# Patient Record
Sex: Male | Born: 1965 | Race: White | Hispanic: No | Marital: Married | Smoking: Current every day smoker
Health system: Southern US, Community
[De-identification: ages and names within clinical notes are randomized; demographics above are authoritative.]

## PROBLEM LIST (undated history)

## (undated) DIAGNOSIS — E785 Hyperlipidemia, unspecified: Secondary | ICD-10-CM

## (undated) HISTORY — DX: Hyperlipidemia, unspecified: E78.5

---

## 2002-02-04 ENCOUNTER — Encounter: Payer: Self-pay | Admitting: Family Medicine

## 2002-02-04 ENCOUNTER — Ambulatory Visit (HOSPITAL_COMMUNITY): Admission: RE | Admit: 2002-02-04 | Discharge: 2002-02-04 | Payer: Self-pay | Admitting: Family Medicine

## 2008-05-19 ENCOUNTER — Ambulatory Visit: Payer: Self-pay | Admitting: Family Medicine

## 2008-05-19 DIAGNOSIS — H5310 Unspecified subjective visual disturbances: Secondary | ICD-10-CM | POA: Insufficient documentation

## 2008-05-19 DIAGNOSIS — R55 Syncope and collapse: Secondary | ICD-10-CM | POA: Insufficient documentation

## 2008-05-22 ENCOUNTER — Encounter (INDEPENDENT_AMBULATORY_CARE_PROVIDER_SITE_OTHER): Payer: Self-pay | Admitting: *Deleted

## 2008-05-22 LAB — CONVERTED CEMR LAB
ALT: 32 units/L (ref 0–53)
AST: 21 units/L (ref 0–37)
Albumin: 4 g/dL (ref 3.5–5.2)
Alkaline Phosphatase: 72 units/L (ref 39–117)
BUN: 9 mg/dL (ref 6–23)
Basophils Absolute: 0 10*3/uL (ref 0.0–0.1)
Basophils Relative: 0.3 % (ref 0.0–3.0)
Bilirubin, Direct: 0.1 mg/dL (ref 0.0–0.3)
CO2: 29 meq/L (ref 19–32)
Calcium: 9.4 mg/dL (ref 8.4–10.5)
Chloride: 109 meq/L (ref 96–112)
Cholesterol: 193 mg/dL (ref 0–200)
Creatinine, Ser: 1 mg/dL (ref 0.4–1.5)
Eosinophils Absolute: 0.2 10*3/uL (ref 0.0–0.7)
Eosinophils Relative: 3 % (ref 0.0–5.0)
GFR calc Af Amer: 106 mL/min
GFR calc non Af Amer: 88 mL/min
Glucose, Bld: 86 mg/dL (ref 70–99)
HCT: 46.1 % (ref 39.0–52.0)
HDL: 37.1 mg/dL — ABNORMAL LOW (ref >39.0)
Hemoglobin: 16.5 g/dL (ref 13.0–17.0)
LDL Cholesterol: 144 mg/dL — ABNORMAL HIGH (ref 0–99)
Lymphocytes Relative: 28.7 % (ref 12.0–46.0)
MCHC: 35.7 g/dL (ref 30.0–36.0)
MCV: 95.8 fL (ref 78.0–100.0)
Monocytes Absolute: 0.6 10*3/uL (ref 0.1–1.0)
Monocytes Relative: 7.8 % (ref 3.0–12.0)
Neutro Abs: 4.3 10*3/uL (ref 1.4–7.7)
Neutrophils Relative %: 60.2 % (ref 43.0–77.0)
Platelets: 172 10*3/uL (ref 150–400)
Potassium: 4.4 meq/L (ref 3.5–5.1)
RBC: 4.81 M/uL (ref 4.22–5.81)
RDW: 12 % (ref 11.5–14.6)
Sodium: 144 meq/L (ref 135–145)
TSH: 1.17 microintl units/mL (ref 0.35–5.50)
Total Bilirubin: 1.3 mg/dL — ABNORMAL HIGH (ref 0.3–1.2)
Total CHOL/HDL Ratio: 5.2
Total Protein: 6.5 g/dL (ref 6.0–8.3)
Triglycerides: 61 mg/dL (ref 0–149)
VLDL: 12 mg/dL (ref 0–40)
WBC: 7.1 10*3/uL (ref 4.5–10.5)

## 2009-06-15 ENCOUNTER — Ambulatory Visit: Payer: Self-pay | Admitting: Family Medicine

## 2009-06-15 DIAGNOSIS — J019 Acute sinusitis, unspecified: Secondary | ICD-10-CM | POA: Insufficient documentation

## 2012-03-22 ENCOUNTER — Telehealth: Payer: Self-pay | Admitting: Family Medicine

## 2012-03-22 ENCOUNTER — Ambulatory Visit: Payer: Self-pay | Admitting: Family Medicine

## 2012-03-22 NOTE — Telephone Encounter (Signed)
Can have any 15 minute opening available

## 2012-03-22 NOTE — Telephone Encounter (Signed)
knott at opening of rectum does not have any pain x 1-wk where can he be worked in to best suit your schedule, please review and advise  Thanks

## 2012-03-22 NOTE — Telephone Encounter (Signed)
Please advise 

## 2012-03-22 NOTE — Telephone Encounter (Signed)
Coming in today at 4pm.

## 2012-03-25 ENCOUNTER — Encounter: Payer: Self-pay | Admitting: Family Medicine

## 2012-03-25 ENCOUNTER — Ambulatory Visit (INDEPENDENT_AMBULATORY_CARE_PROVIDER_SITE_OTHER): Payer: 59 | Admitting: Family Medicine

## 2012-03-25 VITALS — BP 129/82 | HR 87 | Temp 98.6°F | Ht 75.0 in | Wt 207.4 lb

## 2012-03-25 DIAGNOSIS — K6289 Other specified diseases of anus and rectum: Secondary | ICD-10-CM

## 2012-03-25 NOTE — Patient Instructions (Addendum)
We'll call you with your surgery appt This is not infected and there is nothing to worry about at this time Call with any questions or concerns Hang in there!!!

## 2012-03-25 NOTE — Progress Notes (Signed)
  Subjective:    Patient ID: Maurice Fuller, male    DOB: 1966/05/27, 46 y.o.   MRN: [id number]  HPI 'knot in my rear'- 1st noticed last Monday, not painful.  Located at rectum.  No drainage.  Wife doesn't feel it looks like a hemorrhoid.  No hx of similar.  First discovered in the shower.  No lumps elsewhere on the body.   Review of Systems For ROS see HPI     Objective:   Physical Exam  Vitals reviewed. Constitutional: He appears well-developed and well-nourished. No distress.  Genitourinary: Rectal exam shows mass (sebaceous cyst at 10 o'clock position (when upright and leaning forward on table) w/out redness or tenderness ). Rectal exam shows no external hemorrhoid, no fissure, no tenderness and anal tone normal.             Assessment & Plan:

## 2012-03-30 ENCOUNTER — Encounter: Payer: Self-pay | Admitting: Family Medicine

## 2012-03-30 NOTE — Assessment & Plan Note (Signed)
New.  No evidence of infxn so no abx required.  Refer to surgery for excision.  Reviewed dx w/ pt and wife- reassurance provided.

## 2012-04-08 ENCOUNTER — Ambulatory Visit (INDEPENDENT_AMBULATORY_CARE_PROVIDER_SITE_OTHER): Payer: 59 | Admitting: General Surgery

## 2012-05-05 ENCOUNTER — Encounter: Payer: Self-pay | Admitting: Family Medicine

## 2012-05-05 ENCOUNTER — Ambulatory Visit (INDEPENDENT_AMBULATORY_CARE_PROVIDER_SITE_OTHER): Payer: 59 | Admitting: Family Medicine

## 2012-05-05 VITALS — BP 118/75 | HR 73 | Temp 98.1°F | Ht 75.0 in | Wt 206.0 lb

## 2012-05-05 DIAGNOSIS — Z0000 Encounter for general adult medical examination without abnormal findings: Secondary | ICD-10-CM

## 2012-05-05 LAB — LDL CHOLESTEROL, DIRECT: Direct LDL: 179.9 mg/dL

## 2012-05-05 LAB — HEPATIC FUNCTION PANEL
ALT: 32 U/L (ref 0–53)
AST: 20 U/L (ref 0–37)
Albumin: 4 g/dL (ref 3.5–5.2)
Alkaline Phosphatase: 76 U/L (ref 39–117)
Total Bilirubin: 0.9 mg/dL (ref 0.3–1.2)
Total Protein: 6.8 g/dL (ref 6.0–8.3)

## 2012-05-05 LAB — CBC WITH DIFFERENTIAL/PLATELET
Basophils Absolute: 0.1 10*3/uL (ref 0.0–0.1)
Basophils Relative: 0.7 % (ref 0.0–3.0)
Eosinophils Absolute: 0.3 10*3/uL (ref 0.0–0.7)
HCT: 49.3 % (ref 39.0–52.0)
Hemoglobin: 16.4 g/dL (ref 13.0–17.0)
Lymphocytes Relative: 27.5 % (ref 12.0–46.0)
MCHC: 33.3 g/dL (ref 30.0–36.0)
MCV: 93.8 fl (ref 78.0–100.0)
Monocytes Absolute: 0.5 10*3/uL (ref 0.1–1.0)
Monocytes Relative: 6.3 % (ref 3.0–12.0)
Neutro Abs: 5.5 10*3/uL (ref 1.4–7.7)
Neutrophils Relative %: 62.6 % (ref 43.0–77.0)
Platelets: 196 10*3/uL (ref 150.0–400.0)
RBC: 5.25 Mil/uL (ref 4.22–5.81)
RDW: 12.7 % (ref 11.5–14.6)
WBC: 8.8 10*3/uL (ref 4.5–10.5)

## 2012-05-05 LAB — LIPID PANEL
Cholesterol: 226 mg/dL — ABNORMAL HIGH (ref 0–200)
HDL: 45.8 mg/dL (ref >39.00)
Total CHOL/HDL Ratio: 5
VLDL: 18.2 mg/dL (ref 0.0–40.0)

## 2012-05-05 LAB — BASIC METABOLIC PANEL
BUN: 13 mg/dL (ref 6–23)
Calcium: 9.2 mg/dL (ref 8.4–10.5)
Chloride: 107 mEq/L (ref 96–112)
Creatinine, Ser: 0.9 mg/dL (ref 0.4–1.5)
GFR: 97.06 mL/min (ref >60.00)
Glucose, Bld: 101 mg/dL — ABNORMAL HIGH (ref 70–99)
Potassium: 3.9 mEq/L (ref 3.5–5.1)
Sodium: 141 mEq/L (ref 135–145)

## 2012-05-05 LAB — TSH: TSH: 1.02 u[IU]/mL (ref 0.35–5.50)

## 2012-05-05 LAB — PSA: PSA: 1.07 ng/mL (ref 0.10–4.00)

## 2012-05-05 MED ORDER — OMEPRAZOLE 40 MG PO CPDR: 40.0000 mg | DELAYED_RELEASE_CAPSULE | Freq: Every day | ORAL | Status: DC

## 2012-05-05 NOTE — Patient Instructions (Addendum)
Follow up in 1 year or as needed We'll notify you of your lab results and make any changes if needed Schedule an eye exam QUIT SMOKING! Call with any questions or concerns Happy Halloween!!

## 2012-05-05 NOTE — Assessment & Plan Note (Signed)
Pt's PE WNL.  Check labs.  Encouraged smoking cessation.  Anticipatory guidance provided.  

## 2012-05-05 NOTE — Progress Notes (Signed)
  Subjective:    Patient ID: Maurice Fuller., male    DOB: [birth year], 46 y.o.   MRN: [id number]  HPI CPE- no concerns today.   Review of Systems Patient reports no vision/hearing changes, anorexia, fever ,adenopathy, persistant/recurrent hoarseness, swallowing issues, chest pain, palpitations, edema, persistant/recurrent cough, hemoptysis, dyspnea (rest,exertional, paroxysmal nocturnal), gastrointestinal  bleeding (melena, rectal bleeding), abdominal pain, GU symptoms (dysuria, hematuria, voiding/incontinence issues) syncope, focal weakness, memory loss, numbness & tingling, skin/hair/nail changes, depression, anxiety, abnormal bruising/bleeding, musculoskeletal symptoms/signs.  + GERD    Objective:   Physical Exam BP 118/75  Pulse 73  Temp 98.1 F (36.7 C) (Oral)  Ht 6\' 3"  (1.905 m)  Wt 206 lb (93.441 kg)  BMI 25.75 kg/m2  SpO2 97%  General Appearance:    Alert, cooperative, no distress, appears stated age  Head:    Normocephalic, without obvious abnormality, atraumatic  Eyes:    PERRL, conjunctiva/corneas clear, EOM's intact, fundi    benign, both eyes       Ears:    Normal TM's and external ear canals, both ears  Nose:   Nares normal, septum midline, mucosa normal, no drainage   or sinus tenderness  Throat:   Lips, mucosa, and tongue normal; teeth and gums normal  Neck:   Supple, symmetrical, trachea midline, no adenopathy;       thyroid:  No enlargement/tenderness/nodules  Back:     Symmetric, no curvature, ROM normal, no CVA tenderness  Lungs:     Clear to auscultation bilaterally, respirations unlabored  Chest wall:    No tenderness or deformity  Heart:    Regular rate and rhythm, S1 and S2 normal, no murmur, rub   or gallop  Abdomen:     Soft, non-tender, bowel sounds active all four quadrants,    no masses, no organomegaly  Genitalia:    Normal male without lesion, discharge or tenderness  Rectal:    Deferred due to young age  Extremities:   Extremities normal,  atraumatic, no cyanosis or edema  Pulses:   2+ and symmetric all extremities  Skin:   Skin color, texture, turgor normal, no rashes or lesions  Lymph nodes:   Cervical, supraclavicular, and axillary nodes normal  Neurologic:   CNII-XII intact. Normal strength, sensation and reflexes      throughout          Assessment & Plan:

## 2012-05-07 ENCOUNTER — Telehealth: Payer: Self-pay

## 2012-05-07 NOTE — Telephone Encounter (Signed)
Spoke to pt  To advise:  Result Note     Labs look good w/ exception of total cholesterol and LDL.  Based on this, would recommend starting Lipitor 20mg  nightly and repeating LFTs in 6-8 weeks.  Follow up in 6 months to recheck cholesterol.    I scheduled both appts. Lab 06/22/12 8:15am and 6 month f/u 10/26/12 9am for cholesterol.Pt stated understanding. I mailed lab results and wrote pt appts on it per pt request.    MW

## 2012-05-10 ENCOUNTER — Telehealth: Payer: Self-pay | Admitting: Family Medicine

## 2012-05-10 MED ORDER — OMEPRAZOLE 20 MG PO CPDR: 20.0000 mg | DELAYED_RELEASE_CAPSULE | Freq: Every day | ORAL | Status: DC

## 2012-05-10 MED ORDER — ATORVASTATIN CALCIUM 20 MG PO TABS: 20.0000 mg | ORAL_TABLET | Freq: Every day | ORAL | Status: DC

## 2012-05-10 NOTE — Telephone Encounter (Signed)
Rx completed

## 2012-05-10 NOTE — Telephone Encounter (Signed)
Patient needs rx for lipitor 20mg . He uses [facility] in [place].

## 2012-05-10 NOTE — Telephone Encounter (Signed)
Prilosec was cancelled and Lipitor sent.

## 2012-05-10 NOTE — Telephone Encounter (Signed)
Lipitor 20mg - he had stopped all his meds and this is a restart (lab result from October)

## 2012-05-10 NOTE — Telephone Encounter (Signed)
wrong rx sent, per Wife we sent p[rilosec should have been Lipitor. Needs Prilosec cancelled and Liptor sent today of possible

## 2012-05-10 NOTE — Telephone Encounter (Signed)
Pt requesting refill of LIPITOR. Chart states he is on Lipitor 40mg  not 20mg . Please advise.

## 2012-06-22 ENCOUNTER — Other Ambulatory Visit (INDEPENDENT_AMBULATORY_CARE_PROVIDER_SITE_OTHER): Payer: 59

## 2012-06-22 DIAGNOSIS — E785 Hyperlipidemia, unspecified: Secondary | ICD-10-CM

## 2012-06-22 LAB — HEPATIC FUNCTION PANEL
AST: 18 U/L (ref 0–37)
Albumin: 4.2 g/dL (ref 3.5–5.2)
Alkaline Phosphatase: 84 U/L (ref 39–117)
Total Protein: 7 g/dL (ref 6.0–8.3)

## 2012-06-25 ENCOUNTER — Encounter: Payer: Self-pay | Admitting: *Deleted

## 2012-08-11 ENCOUNTER — Ambulatory Visit (INDEPENDENT_AMBULATORY_CARE_PROVIDER_SITE_OTHER): Payer: 59 | Admitting: Family Medicine

## 2012-08-11 VITALS — BP 136/81 | HR 80 | Temp 98.6°F | Resp 16 | Ht 75.0 in | Wt 221.8 lb

## 2012-08-11 DIAGNOSIS — R05 Cough: Secondary | ICD-10-CM

## 2012-08-11 DIAGNOSIS — R059 Cough, unspecified: Secondary | ICD-10-CM

## 2012-08-11 DIAGNOSIS — J40 Bronchitis, not specified as acute or chronic: Secondary | ICD-10-CM

## 2012-08-11 MED ORDER — CEFDINIR 300 MG PO CAPS: 300.0000 mg | ORAL_CAPSULE | Freq: Two times a day (BID) | ORAL | Status: DC

## 2012-08-11 MED ORDER — HYDROCODONE-HOMATROPINE 5-1.5 MG/5ML PO SYRP: 5.0000 mL | ORAL_SOLUTION | Freq: Three times a day (TID) | ORAL | Status: DC | PRN

## 2012-08-11 MED ORDER — ALBUTEROL SULFATE HFA 108 (90 BASE) MCG/ACT IN AERS: 2.0000 | INHALATION_SPRAY | Freq: Four times a day (QID) | RESPIRATORY_TRACT | Status: DC | PRN

## 2012-08-11 NOTE — Progress Notes (Signed)
[facility] [address] [place] [address] [phone]  Date:  08/11/2012   Name:  Maurice Fuller.   DOB:  [birth year]   MRN:  [id number]  PCP:  Neena Rhymes, MD    Chief Complaint: Bronchitis   History of Present Illness:  Maurice Echeverri. is a 47 y.o. very pleasant male patient who presents with the following:  He is here with bronchitis type symtpoms for the last 2 or 3 days- he notes a cough and chest soreness with cough. Cough is non- productive.  He does have some wheezing- this is not new for him, but he does not have an inhaler.   No fever, chills or aches.   No GI symptoms He does have a stuffy nose, and a sore throat due to frequent cough.  No history of CAD.   He is not having any exertional, substernal or pressure- type CP, states this is the same soreness he has had with bronchitis in the past  He is a smoker, has a PCP  Patient Active Problem List  Diagnosis  . VISUAL CHANGES  . SINUSITIS - ACUTE-NOS  . SYNCOPE  . Rectal cyst  . Routine general medical examination at a health care facility    History reviewed. No pertinent past medical history.  History reviewed. No pertinent past surgical history.  History  Substance Use Topics  . Smoking status: Current Every Day Smoker -- 1.0 packs/day for 15 years  . Smokeless tobacco: Not on file  . Alcohol Use: Yes     Comment: occiasional    History reviewed. No pertinent family history.  No Known Allergies  Medication list has been reviewed and updated.  Current Outpatient Prescriptions on File Prior to Visit  Medication Sig Dispense Refill  . atorvastatin (LIPITOR) 20 MG tablet Take 1 tablet (20 mg total) by mouth daily.  90 tablet  3  . omeprazole (PRILOSEC) 20 MG capsule Take 1 capsule (20 mg total) by mouth daily.  30 capsule  3    Review of Systems:  As per HPI- otherwise negative.   Physical Examination: Filed Vitals:   08/11/12 1516  BP: 136/81  Pulse: 80   Temp: 98.6 F (37 C)  Resp: 16   Filed Vitals:   08/11/12 1516  Height: 6\' 3"  (1.905 m)  Weight: 221 lb 12.8 oz (100.608 kg)   Body mass index is 27.72 kg/(m^2). Ideal Body Weight: Weight in (lb) to have BMI = 25: 199.6   GEN: WDWN, NAD, Non-toxic, A & O x 3 HEENT: Atraumatic, Normocephalic. Neck supple. No masses, No LAD.  Bilateral TM wnl, oropharynx normal.  PEERL,EOMI.   Ears and Nose: No external deformity. CV: RRR, No M/G/R. No JVD. No thrill. No extra heart sounds. PULM: CTA B, no wheezes, crackles, rhonchi. No retractions. No resp. distress. No accessory muscle use. EXTR: No c/c/e NEURO Normal gait.  PSYCH: Normally interactive. Conversant. Not depressed or anxious appearing.  Calm demeanor.  Able to reproduce chest discomfort by pressing on his chest   Assessment and Plan: 1. Bronchitis  cefdinir (OMNICEF) 300 MG capsule, albuterol (PROVENTIL HFA;VENTOLIN HFA) 108 (90 BASE) MCG/ACT inhaler  2. Cough  HYDROcodone-homatropine (HYCODAN) 5-1.5 MG/5ML syrup   Bronchitis symptoms.  Treat as above, let me know if not better in the next few days ,Sooner if worse.     Abbe Amsterdam, MD

## 2012-08-11 NOTE — Patient Instructions (Addendum)
Use the inhaler as needed for cough/ wheezing, and the cough syrup for cough.  Let us know if you are not better in the next few days, Sooner if worse.   The cough syrup can make you sleepy- do not use when you are driving

## 2012-10-11 ENCOUNTER — Other Ambulatory Visit: Payer: Self-pay | Admitting: Family Medicine

## 2012-10-26 ENCOUNTER — Ambulatory Visit: Payer: 59 | Admitting: Family Medicine

## 2012-11-16 ENCOUNTER — Ambulatory Visit (INDEPENDENT_AMBULATORY_CARE_PROVIDER_SITE_OTHER): Payer: 59 | Admitting: Family Medicine

## 2012-11-16 ENCOUNTER — Encounter: Payer: Self-pay | Admitting: Family Medicine

## 2012-11-16 ENCOUNTER — Encounter: Payer: Self-pay | Admitting: *Deleted

## 2012-11-16 VITALS — BP 130/70 | HR 66 | Temp 98.2°F | Ht 74.0 in | Wt 216.0 lb

## 2012-11-16 DIAGNOSIS — E785 Hyperlipidemia, unspecified: Secondary | ICD-10-CM | POA: Insufficient documentation

## 2012-11-16 LAB — HEPATIC FUNCTION PANEL
ALT: 33 U/L (ref 0–53)
Bilirubin, Direct: 0.1 mg/dL (ref 0.0–0.3)
Total Bilirubin: 1 mg/dL (ref 0.3–1.2)

## 2012-11-16 LAB — LIPID PANEL
Total CHOL/HDL Ratio: 4
VLDL: 13.8 mg/dL (ref 0.0–40.0)

## 2012-11-16 NOTE — Progress Notes (Signed)
  Subjective:    Patient ID: Nancie Neas., male    DOB: [birth year], 47 y.o.   MRN: [id number]  HPI Hyperlipidemia- dx'd at last visit and started on Lipitor.  No N/V, abd pain, myalgias.  No formal exercise, very busy at work.  No CP, SOB, edema.  Smoking 0.75 ppd.  Thinking about quitting.  Tobacco use-   Review of Systems For ROS see HPI     Objective:   Physical Exam  Vitals reviewed. Constitutional: He is oriented to person, place, and time. He appears well-developed and well-nourished. No distress.  HENT:  Head: Normocephalic and atraumatic.  Eyes: Conjunctivae and EOM are normal. Pupils are equal, round, and reactive to light.  Neck: Normal range of motion. Neck supple. No thyromegaly present.  Cardiovascular: Normal rate, regular rhythm, normal heart sounds and intact distal pulses.   No murmur heard. Pulmonary/Chest: Effort normal and breath sounds normal. No respiratory distress.  Abdominal: Soft. Bowel sounds are normal. He exhibits no distension.  Musculoskeletal: He exhibits no edema.  Lymphadenopathy:    He has no cervical adenopathy.  Neurological: He is alert and oriented to person, place, and time. No cranial nerve deficit.  Skin: Skin is warm and dry.  Psychiatric: He has a normal mood and affect. His behavior is normal.          Assessment & Plan:

## 2012-11-16 NOTE — Assessment & Plan Note (Signed)
New problem dx'd at time of last CPE.  Tolerating statin w/out difficulty.  Check labs.  Adjust meds prn

## 2012-11-16 NOTE — Patient Instructions (Addendum)
Schedule your complete physical for November We'll notify you of your lab results and make any changes if needed Consider switching to the [facility] Call with any questions or concerns Have a great summer!

## 2012-12-23 ENCOUNTER — Other Ambulatory Visit: Payer: Self-pay | Admitting: Family Medicine

## 2013-06-15 ENCOUNTER — Other Ambulatory Visit: Payer: Self-pay | Admitting: Family Medicine

## 2013-06-16 NOTE — Telephone Encounter (Signed)
Rx sent to the pharmacy by e-script.  Pt needs office visit for CPE.//AB/CMA 

## 2013-08-31 ENCOUNTER — Other Ambulatory Visit: Payer: Self-pay | Admitting: Family Medicine

## 2013-09-02 NOTE — Telephone Encounter (Signed)
Med filled only #30.

## 2013-10-17 ENCOUNTER — Other Ambulatory Visit: Payer: Self-pay | Admitting: Family Medicine

## 2013-10-17 NOTE — Telephone Encounter (Signed)
Med filled and letter mailed.  

## 2013-10-19 ENCOUNTER — Other Ambulatory Visit: Payer: Self-pay | Admitting: Family Medicine

## 2013-10-19 NOTE — Telephone Encounter (Signed)
Med filled, letter mailed to pt to make an appt  

## 2013-11-22 ENCOUNTER — Other Ambulatory Visit: Payer: Self-pay | Admitting: Family Medicine

## 2013-11-22 NOTE — Telephone Encounter (Signed)
Med filled, pt has an appt later this month.

## 2013-12-01 ENCOUNTER — Encounter: Payer: Self-pay | Admitting: General Practice

## 2013-12-01 ENCOUNTER — Encounter: Payer: Self-pay | Admitting: Family Medicine

## 2013-12-01 ENCOUNTER — Ambulatory Visit (INDEPENDENT_AMBULATORY_CARE_PROVIDER_SITE_OTHER): Payer: 59 | Admitting: Family Medicine

## 2013-12-01 VITALS — BP 128/72 | HR 68 | Temp 97.7°F | Resp 16 | Wt 200.4 lb

## 2013-12-01 DIAGNOSIS — F17200 Nicotine dependence, unspecified, uncomplicated: Secondary | ICD-10-CM

## 2013-12-01 DIAGNOSIS — E785 Hyperlipidemia, unspecified: Secondary | ICD-10-CM

## 2013-12-01 DIAGNOSIS — R21 Rash and other nonspecific skin eruption: Secondary | ICD-10-CM | POA: Insufficient documentation

## 2013-12-01 LAB — LIPID PANEL
CHOLESTEROL: 153 mg/dL (ref 0–200)
HDL: 46 mg/dL (ref >39.00)
LDL Cholesterol: 96 mg/dL (ref 0–99)
Total CHOL/HDL Ratio: 3
Triglycerides: 56 mg/dL (ref 0.0–149.0)
VLDL: 11.2 mg/dL (ref 0.0–40.0)

## 2013-12-01 LAB — BASIC METABOLIC PANEL
BUN: 14 mg/dL (ref 6–23)
CO2: 25 meq/L (ref 19–32)
Calcium: 9.2 mg/dL (ref 8.4–10.5)
Chloride: 108 mEq/L (ref 96–112)
Creatinine, Ser: 0.9 mg/dL (ref 0.4–1.5)
GFR: 92.8 mL/min (ref >60.00)
GLUCOSE: 90 mg/dL (ref 70–99)
POTASSIUM: 3.8 meq/L (ref 3.5–5.1)
Sodium: 140 mEq/L (ref 135–145)

## 2013-12-01 LAB — HEPATIC FUNCTION PANEL
ALBUMIN: 4.2 g/dL (ref 3.5–5.2)
ALT: 26 U/L (ref 0–53)
AST: 19 U/L (ref 0–37)
Alkaline Phosphatase: 71 U/L (ref 39–117)
Bilirubin, Direct: 0.1 mg/dL (ref 0.0–0.3)
TOTAL PROTEIN: 6.9 g/dL (ref 6.0–8.3)
Total Bilirubin: 0.8 mg/dL (ref 0.2–1.2)

## 2013-12-01 LAB — TSH: TSH: 0.85 u[IU]/mL (ref 0.35–4.50)

## 2013-12-01 MED ORDER — CLOTRIMAZOLE-BETAMETHASONE 1-0.05 % EX CREA: 1.0000 "application " | TOPICAL_CREAM | Freq: Two times a day (BID) | CUTANEOUS | Status: DC

## 2013-12-01 NOTE — Progress Notes (Signed)
Pre visit review using our clinic review tool, if applicable. No additional management support is needed unless otherwise documented below in the visit note. 

## 2013-12-01 NOTE — Patient Instructions (Signed)
Schedule your complete physical in 6 months We'll notify you of your lab results and make any changes if needed Start the Lotrisone cream twice daily for the groin rash- if no improvement in 2 weeks, call me and we can treat for possible scabies Please consider quitting smoking!  You can do this! Call with any questions or concerns Have a great summer!!!

## 2013-12-01 NOTE — Assessment & Plan Note (Signed)
New to proivder, ongoing for pt.  Strongly encouraged him to quit.  Pt agrees that he needs to quit but is not committed at this time.  Will follow.

## 2013-12-01 NOTE — Assessment & Plan Note (Signed)
Chronic problem.  Tolerating statin w/o difficulty.  Check labs.  Adjust meds prn  

## 2013-12-01 NOTE — Progress Notes (Signed)
   Subjective:    Patient ID: Maurice Neas., male    DOB: April 10, 1966, 48 y.o.   MRN: [id number]  HPI Hyperlipidemia- chronic problem, on Lipitor daily.  No abd pain, N/V, CP, SOB, HAs, visual changes, edema, myalgias.  Tobacco use- pt considering quitting but does not have a plan and isn't committed to doing so at this time.  Rash- groin bilaterally, itchy, worse w/ sweat.  Has tried OTC creams w/ some relief but always returns.  sxs started 1 month ago.  No other household contacts w/ similar.   Review of Systems For ROS see HPI     Objective:   Physical Exam  Vitals reviewed. Constitutional: He is oriented to person, place, and time. He appears well-developed and well-nourished. No distress.  HENT:  Head: Normocephalic and atraumatic.  Eyes: Conjunctivae and EOM are normal. Pupils are equal, round, and reactive to light.  Neck: Normal range of motion. Neck supple. No thyromegaly present.  Cardiovascular: Normal rate, regular rhythm, normal heart sounds and intact distal pulses.   No murmur heard. Pulmonary/Chest: Effort normal and breath sounds normal. No respiratory distress.  Abdominal: Soft. Bowel sounds are normal. He exhibits no distension.  Musculoskeletal: He exhibits no edema.  Lymphadenopathy:    He has no cervical adenopathy.  Neurological: He is alert and oriented to person, place, and time. No cranial nerve deficit.  Skin: Skin is warm and dry. There is erythema (bilateral groin creases- consistent w/ fungal rash).  Psychiatric: He has a normal mood and affect. His behavior is normal.          Assessment & Plan:

## 2013-12-01 NOTE — Assessment & Plan Note (Signed)
New.  Suspect this is fungal.  Start Lotrisone cream.  If no improvement, will need to consider treating for scabies.  Pt expressed understanding and is in agreement w/ plan.

## 2013-12-29 ENCOUNTER — Other Ambulatory Visit: Payer: Self-pay | Admitting: Family Medicine

## 2013-12-29 NOTE — Telephone Encounter (Signed)
Med filled.  

## 2014-01-04 ENCOUNTER — Ambulatory Visit: Payer: 59 | Admitting: Family Medicine

## 2014-03-20 ENCOUNTER — Other Ambulatory Visit: Payer: Self-pay | Admitting: Family Medicine

## 2014-03-20 NOTE — Telephone Encounter (Signed)
Med filled.  

## 2014-03-30 ENCOUNTER — Telehealth: Payer: Self-pay | Admitting: Family Medicine

## 2014-03-30 MED ORDER — NYSTATIN-TRIAMCINOLONE 100000-0.1 UNIT/GM-% EX OINT: 1.0000 "application " | TOPICAL_OINTMENT | Freq: Two times a day (BID) | CUTANEOUS | Status: DC

## 2014-03-30 NOTE — Telephone Encounter (Signed)
Encounter closed due to another phone note being opened.

## 2014-03-30 NOTE — Telephone Encounter (Signed)
Caller name:Teri Consuegra Relation to ZO:XWRUEA Call back [phone] Pharmacy:[facility]  Reason for call: pt states that the rx clotrimazole-betamethasone (LOTRISONE) cream Is not working. States dr. Beverely Low stated to make her aware so she can try something else.

## 2014-03-30 NOTE — Telephone Encounter (Signed)
Will send in new script for Mycolog but if no improvement w/ this, will need derm referral

## 2014-03-30 NOTE — Telephone Encounter (Signed)
Per PT wife: the medication did not work. States dr. Beverely Low wanted to be made aware so she can try something else. (this was in a second phone note)    Called and spoke with pt, rash seemed to go away with lotrisone use but kept returning. Rash is about the same each time, this week flare up is worse. Located in groin area.

## 2014-03-30 NOTE — Telephone Encounter (Signed)
Caller name: [place]  Relation to pt: self  Call back number:[phone] Pharmacy: [facility] [address], [place], [place] [address]  [phone]   Reason for call: clotrimazole-betamethasone (LOTRISONE) cream is not working requesting a different cream.please advise

## 2014-03-30 NOTE — Telephone Encounter (Signed)
Will notify pt

## 2014-03-30 NOTE — Telephone Encounter (Signed)
Pt was last seen for this 4 months ago.  Did the rash improve and then return or has it been constant for 4 months.  Has it spread?  Is it worse?  Need more info prior to making decision on meds

## 2014-04-07 NOTE — Telephone Encounter (Signed)
PA for Nystatin-Triamcinolone cream initiated on [email or web address]. Awaiting determination. JG//CMA

## 2014-04-10 ENCOUNTER — Telehealth: Payer: Self-pay | Admitting: Family Medicine

## 2014-04-10 MED ORDER — NYSTATIN 100000 UNIT/GM EX OINT: 1.0000 "application " | TOPICAL_OINTMENT | Freq: Two times a day (BID) | CUTANEOUS | Status: DC

## 2014-04-10 NOTE — Telephone Encounter (Signed)
Ok to try Mycostatin in combination w/ triamcinolone 0.1%

## 2014-04-10 NOTE — Telephone Encounter (Signed)
PA for Nystatin/Triamcinolone cream denied. Alternatives include generic Mycostatin used in combination with triamcinolone 0.1% or generic Kenalog. Please advise if either option is ok. JG//CMA

## 2014-04-10 NOTE — Telephone Encounter (Signed)
Mycostatin e-scribed to patient's pharmacy. JG//CMA

## 2014-04-10 NOTE — Telephone Encounter (Signed)
Caller name: [facility]  Relation to pt: other Call back number: [phone] [facility]   Reason for call:   pharmacy would like to know does the patient need to be on both medication clotrimazole-betamethasone (LOTRISONE) cream & nystatin ointment (MYCOSTATIN)

## 2014-04-10 NOTE — Telephone Encounter (Signed)
Pharmacy notified that pt should be on mycostatin.

## 2014-09-08 ENCOUNTER — Ambulatory Visit (INDEPENDENT_AMBULATORY_CARE_PROVIDER_SITE_OTHER): Payer: 59 | Admitting: Family Medicine

## 2014-09-08 ENCOUNTER — Telehealth: Payer: Self-pay | Admitting: Family Medicine

## 2014-09-08 ENCOUNTER — Encounter: Payer: Self-pay | Admitting: Family Medicine

## 2014-09-08 VITALS — BP 126/70 | HR 73 | Temp 97.9°F | Resp 16 | Wt 206.2 lb

## 2014-09-08 DIAGNOSIS — R21 Rash and other nonspecific skin eruption: Secondary | ICD-10-CM

## 2014-09-08 DIAGNOSIS — E785 Hyperlipidemia, unspecified: Secondary | ICD-10-CM

## 2014-09-08 LAB — LIPID PANEL
Cholesterol: 131 mg/dL (ref 0–200)
HDL: 38.3 mg/dL — AB (ref >39.00)
LDL Cholesterol: 71 mg/dL (ref 0–99)
NonHDL: 92.7
TRIGLYCERIDES: 108 mg/dL (ref 0.0–149.0)
Total CHOL/HDL Ratio: 3
VLDL: 21.6 mg/dL (ref 0.0–40.0)

## 2014-09-08 LAB — BASIC METABOLIC PANEL
BUN: 11 mg/dL (ref 6–23)
CALCIUM: 9.5 mg/dL (ref 8.4–10.5)
CO2: 31 mEq/L (ref 19–32)
CREATININE: 0.96 mg/dL (ref 0.40–1.50)
Chloride: 107 mEq/L (ref 96–112)
GFR: 89.17 mL/min (ref >60.00)
GLUCOSE: 95 mg/dL (ref 70–99)
Potassium: 4.7 mEq/L (ref 3.5–5.1)
Sodium: 140 mEq/L (ref 135–145)

## 2014-09-08 LAB — HEPATIC FUNCTION PANEL
ALK PHOS: 76 U/L (ref 39–117)
ALT: 20 U/L (ref 0–53)
AST: 17 U/L (ref 0–37)
Albumin: 4.4 g/dL (ref 3.5–5.2)
BILIRUBIN DIRECT: 0.1 mg/dL (ref 0.0–0.3)
BILIRUBIN TOTAL: 0.6 mg/dL (ref 0.2–1.2)
TOTAL PROTEIN: 6.9 g/dL (ref 6.0–8.3)

## 2014-09-08 MED ORDER — TOLNAFTATE 1 % EX POWD: 1.0000 "application " | Freq: Two times a day (BID) | CUTANEOUS | Status: DC

## 2014-09-08 MED ORDER — TERBINAFINE HCL 1 % EX CREA: 1.0000 "application " | TOPICAL_CREAM | Freq: Two times a day (BID) | CUTANEOUS | Status: DC

## 2014-09-08 MED ORDER — HYDROXYZINE HCL 50 MG PO TABS: 50.0000 mg | ORAL_TABLET | Freq: Three times a day (TID) | ORAL | Status: DC | PRN

## 2014-09-08 NOTE — Assessment & Plan Note (Signed)
Chronic problem, tolerating statin w/o difficulty.  Check labs.  Adjust meds prn  

## 2014-09-08 NOTE — Patient Instructions (Signed)
Schedule your complete physical in 3 months We'll notify you of your lab results and make any changes if needed Apply the cream twice daily (this is one antifungal) You can also use the powder twice daily (this is a different antifungal)- or alternate between the cream and the powder If no improvement in the next 1-2 weeks, call me so we can send you to derm Call with any questions or concerns Hang in there!!

## 2014-09-08 NOTE — Progress Notes (Signed)
   Subjective:    Patient ID: Maurice NeasJerry L Notaro Jr., male    DOB: [birth year], 49 y.o.   MRN: [id number]  HPI Rash- pt has hx of similar.  Currently in groin.  Has been using the Nystatin cream w/ some relief until 3 nights ago when itching became unbearable.  No change in soaps, laundry detergent.  No close contacts w/ similar rash.  Hyperlipidemia- chronic problem, on Lipitor.  Denies abd pain, N/V, CP, SOB, HAs, visual changes.   Review of Systems For ROS see HPI     Objective:   Physical Exam  Constitutional: He is oriented to person, place, and time. He appears well-developed and well-nourished. No distress.  HENT:  Head: Normocephalic and atraumatic.  Eyes: Conjunctivae and EOM are normal. Pupils are equal, round, and reactive to light.  Neck: Normal range of motion. Neck supple. No thyromegaly present.  Cardiovascular: Normal rate, regular rhythm, normal heart sounds and intact distal pulses.   No murmur heard. Pulmonary/Chest: Effort normal and breath sounds normal. No respiratory distress.  Abdominal: Soft. Bowel sounds are normal. He exhibits no distension.  Musculoskeletal: He exhibits no edema.  Lymphadenopathy:    He has no cervical adenopathy.  Neurological: He is alert and oriented to person, place, and time. No cranial nerve deficit.  Skin: Skin is warm and dry. Rash (erythematous maculopapular rash in groin and behind R knee) noted.  Psychiatric: He has a normal mood and affect. His behavior is normal.  Vitals reviewed.         Assessment & Plan:

## 2014-09-08 NOTE — Progress Notes (Signed)
Pre visit review using our clinic review tool, if applicable. No additional management support is needed unless otherwise documented below in the visit note. 

## 2014-09-08 NOTE — Assessment & Plan Note (Signed)
Deteriorated.  Appears consistent w/ tinea cruris.  Will temporarily improve w/ nystatin and clotrimazole/triamcinolone but the flare.  Pt reports sxs returned after sweating w/ the warm weather earlier this week.  Will alternate antifungal cream w/ powder as well as give him Atarax for itching.  If no improvement, will refer to derm.  Pt expressed understanding and is in agreement w/ plan.

## 2014-09-08 NOTE — Telephone Encounter (Signed)
emmi mailed  °

## 2014-10-02 ENCOUNTER — Other Ambulatory Visit: Payer: Self-pay | Admitting: Family Medicine

## 2014-11-22 ENCOUNTER — Telehealth: Payer: Self-pay | Admitting: Family Medicine

## 2014-11-22 NOTE — Telephone Encounter (Signed)
Pre Visit letter sent  °

## 2014-11-23 ENCOUNTER — Other Ambulatory Visit: Payer: Self-pay | Admitting: Family Medicine

## 2014-11-23 NOTE — Telephone Encounter (Signed)
Med filled.  

## 2014-12-14 ENCOUNTER — Telehealth: Payer: Self-pay | Admitting: *Deleted

## 2014-12-14 ENCOUNTER — Encounter: Payer: Self-pay | Admitting: *Deleted

## 2014-12-14 NOTE — Telephone Encounter (Signed)
Pre-Visit Call completed with patient and chart updated.   Pre-Visit Info documented in Specialty Comments under [employer].    

## 2014-12-15 ENCOUNTER — Encounter: Payer: 59 | Admitting: Family Medicine

## 2014-12-15 ENCOUNTER — Telehealth: Payer: Self-pay | Admitting: Family Medicine

## 2014-12-15 DIAGNOSIS — Z0289 Encounter for other administrative examinations: Secondary | ICD-10-CM

## 2014-12-15 NOTE — Telephone Encounter (Signed)
Pt called same day to cancel cpe on 12/15/14- we attempted to contact to assist with rescheduling but no answer.  Charge the pt for no show?

## 2014-12-15 NOTE — Telephone Encounter (Signed)
Yes- needs a charge

## 2015-01-23 ENCOUNTER — Ambulatory Visit (HOSPITAL_BASED_OUTPATIENT_CLINIC_OR_DEPARTMENT_OTHER)
Admission: RE | Admit: 2015-01-23 | Discharge: 2015-01-23 | Disposition: A | Discharge status: Home / Self Care | Payer: 59 | Source: Ambulatory Visit | Attending: Physician Assistant | Admitting: Physician Assistant

## 2015-01-23 ENCOUNTER — Other Ambulatory Visit: Payer: Self-pay | Admitting: Physician Assistant

## 2015-01-23 ENCOUNTER — Telehealth: Payer: Self-pay | Admitting: Family Medicine

## 2015-01-23 ENCOUNTER — Encounter: Payer: Self-pay | Admitting: Physician Assistant

## 2015-01-23 ENCOUNTER — Ambulatory Visit (INDEPENDENT_AMBULATORY_CARE_PROVIDER_SITE_OTHER): Payer: 59 | Admitting: Physician Assistant

## 2015-01-23 DIAGNOSIS — M549 Dorsalgia, unspecified: Secondary | ICD-10-CM | POA: Diagnosis not present

## 2015-01-23 DIAGNOSIS — R0781 Pleurodynia: Secondary | ICD-10-CM | POA: Diagnosis not present

## 2015-01-23 MED ORDER — CYCLOBENZAPRINE HCL 10 MG PO TABS: 10.0000 mg | ORAL_TABLET | Freq: Every day | ORAL | Status: DC

## 2015-01-23 MED ORDER — NAPROXEN SODIUM 550 MG PO TABS: 550.0000 mg | ORAL_TABLET | Freq: Two times a day (BID) | ORAL | Status: DC

## 2015-01-23 NOTE — Telephone Encounter (Signed)
Medication has been sent.  

## 2015-01-23 NOTE — Assessment & Plan Note (Signed)
Suspect contusion to posterior ribs and strain of rotator cuff musculature. No gross palpable abnormality noted. Will obtain x-ray to r/o fracture giving trauma. Rx Anaprox DS to take one tablet twice daily with food. Supportive measures reviewed -- Aspercreme/Salon Pas, Rest, Heat/Ice.
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
## 2015-01-23 NOTE — Telephone Encounter (Signed)
Informed patient of this.  °

## 2015-01-23 NOTE — Patient Instructions (Signed)
Please go downstairs for x-ray. I will call you with your results. Please take Anaprox as directed with food. Avoid heavy lifting. Apply topical Aspercreme or Salon Pas to the area. Apply heating pad to area for 10 minutes 2-3 times per day. Symptoms should improve over the next week.

## 2015-01-23 NOTE — Telephone Encounter (Signed)
Caller name: Estanislado Relation to ZO:XWRUpt:self Call back number:[phone] Pharmacy:  Reason for call:   Patient thought that Selena BattenCody was going to call in a muscle relaxer but the pharmacy states that they did not receive rx.

## 2015-01-23 NOTE — Progress Notes (Signed)
Pre visit review using our clinic review tool, if applicable. No additional management support is needed unless otherwise documented below in the visit note. 

## 2015-05-07 ENCOUNTER — Ambulatory Visit (HOSPITAL_BASED_OUTPATIENT_CLINIC_OR_DEPARTMENT_OTHER)
Admission: RE | Admit: 2015-05-07 | Discharge: 2015-05-07 | Disposition: A | Discharge status: Home / Self Care | Payer: 59 | Source: Ambulatory Visit | Attending: Medical | Admitting: Medical

## 2015-05-07 ENCOUNTER — Encounter: Payer: Self-pay | Admitting: Medical

## 2015-05-07 ENCOUNTER — Ambulatory Visit (INDEPENDENT_AMBULATORY_CARE_PROVIDER_SITE_OTHER): Payer: 59 | Admitting: Medical

## 2015-05-07 VITALS — BP 128/76 | HR 77 | Temp 98.1°F | Ht 74.0 in | Wt 195.2 lb

## 2015-05-07 DIAGNOSIS — M5440 Lumbago with sciatica, unspecified side: Secondary | ICD-10-CM

## 2015-05-07 DIAGNOSIS — M545 Low back pain: Secondary | ICD-10-CM | POA: Insufficient documentation

## 2015-05-07 DIAGNOSIS — M47897 Other spondylosis, lumbosacral region: Secondary | ICD-10-CM | POA: Diagnosis not present

## 2015-05-07 DIAGNOSIS — M5416 Radiculopathy, lumbar region: Secondary | ICD-10-CM | POA: Diagnosis not present

## 2015-05-07 MED ORDER — HYDROCODONE-ACETAMINOPHEN 5-325 MG PO TABS: 1.0000 | ORAL_TABLET | Freq: Four times a day (QID) | ORAL | Status: DC | PRN

## 2015-05-07 MED ORDER — DICLOFENAC SODIUM 75 MG PO TBEC: 75.0000 mg | DELAYED_RELEASE_TABLET | Freq: Two times a day (BID) | ORAL | Status: DC

## 2015-05-07 NOTE — Progress Notes (Signed)
Subjective:    Patient ID: Maurice NeasJerry L Lindell Jr., male    DOB: [birth year], 49 y.o.   MRN: [id number]  HPI   Pt in with some lower back pain. Pt states some pain for 2 wks. Pt states worse pain since past Tuesday. Pt states low level pain for 2 wks. Pt has some pain intermittently since age 89 yo. Pt states mild achy most of the days. 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  Pain is less standing up. But if he lays or sits pain is worse. Some pain shooting down his left leg since past Tuesday(around that time go out of car and twisted). Felt sharp pain and sine then pain started to radiate. Some pain shooting toward his left groin after riding in car but then pain subsided.  No saddle anesthesia. No leg weakness.   Review of Systems  Constitutional: Negative for fever, chills and fatigue.  Respiratory: Negative for cough, chest tightness and wheezing.   Cardiovascular: Negative for chest pain and palpitations.  Gastrointestinal: Negative for nausea, vomiting and abdominal pain.  Genitourinary: Negative for dysuria, urgency, hematuria and flank pain.  Musculoskeletal: Positive for back pain.  Neurological: Negative for weakness and numbness.       Some radicular pain.  Hematological: Negative for adenopathy. Does not bruise/bleed easily.  Psychiatric/Behavioral: Negative for behavioral problems and confusion.    Past Medical History  Diagnosis Date  . Hyperlipidemia     Social History   Social History  . Marital Status: Married    Spouse Name: N/A  . Number of Children: N/A  . Years of Education: N/A   Occupational History  . Not on file.   Social History Main Topics  . Smoking status: Current Every Day Smoker -- 1.00 packs/day for 15 years  . Smokeless tobacco: Not on file  . Alcohol Use: Yes     Comment: occiasional  . Drug Use: No  . Sexual Activity: Yes   Other Topics Concern  . Not on file   Social History Narrative    No past surgical history on  file.  No family history on file.  No Known Allergies  Current Outpatient Prescriptions on File Prior to Visit  Medication Sig Dispense Refill  . atorvastatin (LIPITOR) 20 MG tablet take 1 tablet by mouth once daily 30 tablet 6  . cyclobenzaprine (FLEXERIL) 10 MG tablet Take 1 tablet (10 mg total) by mouth at bedtime. 30 tablet 0  . hydrOXYzine (ATARAX/VISTARIL) 50 MG tablet Take 1 tablet (50 mg total) by mouth 3 (three) times daily as needed. 45 tablet 0  . naproxen sodium (ANAPROX DS) 550 MG tablet Take 1 tablet (550 mg total) by mouth 2 (two) times daily with a meal. 30 tablet 0  . omeprazole (PRILOSEC) 40 MG capsule take 1 capsule by mouth once daily 30 capsule 5  . terbinafine (LAMISIL) 1 % cream Apply 1 application topically 2 (two) times daily. 42 g 1  . tolnaftate (LAMISIL AF DEFENSE) 1 % powder Apply 1 application topically 2 (two) times daily. 45 g 0   No current facility-administered medications on file prior to visit.    BP 128/76 mmHg  Pulse 77  Temp(Src) 98.1 F (36.7 C) (Oral)  Ht 6\' 2"  (1.88 m)  Wt 195 lb 3.2 oz (88.542 kg)  BMI 25.05 kg/m2  SpO2 98%       Objective:   Physical Exam  General Appearance- Not in acute distress.    Chest  and Lung Exam Auscultation: Breath sounds:-Normal. Clear even and unlabored. Adventitious sounds:- No Adventitious sounds.  Cardiovascular Auscultation:Rythm - Regular, rate and rythm. Heart Sounds -Normal heart sounds.  Abdomen Inspection:-Inspection Normal.  Palpation/Perucssion: Palpation and Percussion of the abdomen reveal- Non Tender, No Rebound tenderness, No rigidity(Guarding) and No Palpable abdominal masses.  Liver:-Normal.  Spleen:- Normal.   Back Mid lumbar spine tenderness to palpation. Pain on straight leg lift. Pain on lateral movements and flexion/extension of the spine.  Lower ext neurologic  L5-S1 sensation intact bilaterally. Normal patellar reflexes bilaterally. No foot drop  bilaterally.      Assessment & Plan:  For your back pain, I will get lumbar xray.  Rx diclofenac. Stop naproxen. [place] flexeril at night. Limited rx of norco. Rx advisement given.  Stretching back exercises.  If pain persists consider PT or mri.  If pain persists with foot drop, saddle anesthesia, incontinence,  Or leg weakness then ED evaluation.  Follow up in 10 days or as needed

## 2015-05-07 NOTE — Patient Instructions (Addendum)
For your back pain, I will get lumbar xray.  Rx diclofenac. Stop naproxen. [place] flexeril at night. Limited rx of norco. Rx advisement given.  Stretching back exercises.  If pain persists consider PT or mri.  If pain persists with foot drop, saddle anesthesia, incontinence,  Or leg weakness then ED evaluation.  Follow up in 10 days or as needed.

## 2015-05-07 NOTE — Progress Notes (Signed)
Pre visit review using our clinic review tool, if applicable. No additional management support is needed unless otherwise documented below in the visit note. 

## 2015-06-18 ENCOUNTER — Ambulatory Visit (INDEPENDENT_AMBULATORY_CARE_PROVIDER_SITE_OTHER): Payer: 59 | Admitting: Family Medicine

## 2015-06-18 ENCOUNTER — Encounter: Payer: Self-pay | Admitting: Family Medicine

## 2015-06-18 VITALS — BP 126/76 | HR 62 | Temp 98.0°F | Resp 16 | Ht 74.0 in | Wt 193.0 lb

## 2015-06-18 DIAGNOSIS — M5416 Radiculopathy, lumbar region: Secondary | ICD-10-CM | POA: Diagnosis not present

## 2015-06-18 DIAGNOSIS — R1030 Lower abdominal pain, unspecified: Secondary | ICD-10-CM

## 2015-06-18 DIAGNOSIS — R1032 Left lower quadrant pain: Secondary | ICD-10-CM

## 2015-06-18 LAB — POCT URINALYSIS DIPSTICK
Bilirubin, UA: NEGATIVE
Blood, UA: NEGATIVE
GLUCOSE UA: NEGATIVE
KETONES UA: NEGATIVE
Leukocytes, UA: NEGATIVE
Nitrite, UA: NEGATIVE
UROBILINOGEN UA: 0.2
pH, UA: 6

## 2015-06-18 MED ORDER — PREDNISONE 10 MG PO TABS: ORAL_TABLET | ORAL | Status: DC

## 2015-06-18 NOTE — Assessment & Plan Note (Signed)
New.  Concern for possible inguinal hernia causing his radiating groin pain.  No testicular abnormality on PE.  Check UA, PSA, and CBC to r/o infxn.  Refer to general surgery for evaluation of possible inguinal hernia.  Discussed lifting precautions and red flags that should prompt immediate medical evaluation.  Pt expressed understanding and is in agreement w/ plan.

## 2015-06-18 NOTE — Progress Notes (Signed)
   Subjective:    Patient ID: Maurice NeasJerry L Redder Jr., male    DOB: 01-27-1966, 49 y.o.   MRN: [id number]  HPI LBP- pt injured his back the end of October while getting out of his [employer].  Was doing a twisting motion and since then has had pain in L lower back w/ radiation into butt or thighs.  Pt is also having L sided abd pain w/ near constant sensation of 'being punched in the balls'.  Painful to sit.  Groin pain started a few weeks after initial back pain.  No burning w/ urination.  Pt frequently does heavy lifting.   Review of Systems For ROS see HPI     Objective:   Physical Exam  Constitutional: He is oriented to person, place, and time. He appears well-developed and well-nourished.  Obviously uncomfortable  HENT:  Head: Normocephalic and atraumatic.  Cardiovascular: Intact distal pulses.   Abdominal: Soft. He exhibits no mass. There is tenderness (TTP over L inguinal canal w/o obvious bulge but possible muscle defect.  mild TTP over L scrotum w/o erythema, warmth, or testicular nodularity or enlargement). There is no rebound and no guarding.  Musculoskeletal:  Pain w/ sitting, pain w/ back flexion > extension  Neurological: He is alert and oriented to person, place, and time. He has normal reflexes.  Antalgic gait + SLR on L  Skin: Skin is warm and dry. No erythema.  Psychiatric: He has a normal mood and affect. His behavior is normal. Thought content normal.  Vitals reviewed.         Assessment & Plan:

## 2015-06-18 NOTE — Progress Notes (Signed)
Pre visit review using our clinic review tool, if applicable. No additional management support is needed unless otherwise documented below in the visit note. 

## 2015-06-18 NOTE — Patient Instructions (Signed)
Follow up as needed We'll notify you of your lab results and make any changes if needed Start the Prednisone as directed HEAT your lower back! We'll call you with your Ortho appt for the back and the General Surgery appt for the hernia evaluation If the pain suddenly worsens, please go to the ER If you want to join us at the new [place] office, any scheduled appointments will automatically transfer and we will see you at [address] [place] [address], [place], [place] [address] (OPENING 07/10/15) Hang in there! Happy Holidays and Happy Early [place] OuchBirthday!!

## 2015-06-18 NOTE — Assessment & Plan Note (Signed)
Pt has had >6 weeks of sxs w/o improvement.  Start pred taper and refer to ortho for complete evaluation and tx.  Reviewed supportive care and red flags that should prompt return.  Pt expressed understanding and is in agreement w/ plan.

## 2015-06-19 LAB — CBC WITH DIFFERENTIAL/PLATELET
BASOS ABS: 0.1 10*3/uL (ref 0.0–0.1)
BASOS PCT: 1.5 % (ref 0.0–3.0)
EOS PCT: 3.6 % (ref 0.0–5.0)
Eosinophils Absolute: 0.3 10*3/uL (ref 0.0–0.7)
HEMATOCRIT: 46.8 % (ref 39.0–52.0)
Hemoglobin: 15.6 g/dL (ref 13.0–17.0)
LYMPHS PCT: 31.5 % (ref 12.0–46.0)
Lymphs Abs: 2.3 10*3/uL (ref 0.7–4.0)
MCHC: 33.4 g/dL (ref 30.0–36.0)
MCV: 92.4 fl (ref 78.0–100.0)
MONOS PCT: 7.9 % (ref 3.0–12.0)
Monocytes Absolute: 0.6 10*3/uL (ref 0.1–1.0)
NEUTROS ABS: 4.1 10*3/uL (ref 1.4–7.7)
Neutrophils Relative %: 55.5 % (ref 43.0–77.0)
PLATELETS: 216 10*3/uL (ref 150.0–400.0)
RBC: 5.06 Mil/uL (ref 4.22–5.81)
RDW: 12.6 % (ref 11.5–15.5)
WBC: 7.3 10*3/uL (ref 4.0–10.5)

## 2015-06-19 LAB — PSA: PSA: 1.3 ng/mL (ref 0.10–4.00)

## 2015-06-26 ENCOUNTER — Telehealth: Payer: Self-pay

## 2015-06-26 NOTE — Telephone Encounter (Signed)
Pre Visit call completed. 

## 2015-06-27 ENCOUNTER — Ambulatory Visit (INDEPENDENT_AMBULATORY_CARE_PROVIDER_SITE_OTHER): Payer: 59 | Admitting: Family Medicine

## 2015-06-27 ENCOUNTER — Encounter: Payer: Self-pay | Admitting: Family Medicine

## 2015-06-27 ENCOUNTER — Encounter: Payer: Self-pay | Admitting: General Practice

## 2015-06-27 VITALS — BP 124/74 | HR 77 | Temp 98.1°F | Resp 17 | Ht 74.0 in | Wt 191.4 lb

## 2015-06-27 DIAGNOSIS — Z0000 Encounter for general adult medical examination without abnormal findings: Secondary | ICD-10-CM

## 2015-06-27 LAB — CBC WITH DIFFERENTIAL/PLATELET
BASOS ABS: 0.1 10*3/uL (ref 0.0–0.1)
Basophils Relative: 0.5 % (ref 0.0–3.0)
EOS ABS: 0.2 10*3/uL (ref 0.0–0.7)
Eosinophils Relative: 1.6 % (ref 0.0–5.0)
HEMATOCRIT: 48.7 % (ref 39.0–52.0)
Hemoglobin: 16.1 g/dL (ref 13.0–17.0)
LYMPHS PCT: 20 % (ref 12.0–46.0)
Lymphs Abs: 2.3 10*3/uL (ref 0.7–4.0)
MCHC: 33.2 g/dL (ref 30.0–36.0)
MCV: 92.6 fl (ref 78.0–100.0)
MONOS PCT: 4.8 % (ref 3.0–12.0)
Monocytes Absolute: 0.6 10*3/uL (ref 0.1–1.0)
NEUTROS ABS: 8.4 10*3/uL — AB (ref 1.4–7.7)
Neutrophils Relative %: 73.1 % (ref 43.0–77.0)
Platelets: 253 10*3/uL (ref 150.0–400.0)
RBC: 5.26 Mil/uL (ref 4.22–5.81)
RDW: 13.3 % (ref 11.5–15.5)
WBC: 11.5 10*3/uL — ABNORMAL HIGH (ref 4.0–10.5)

## 2015-06-27 LAB — HEPATIC FUNCTION PANEL
ALBUMIN: 4.4 g/dL (ref 3.5–5.2)
ALT: 26 U/L (ref 0–53)
AST: 15 U/L (ref 0–37)
Alkaline Phosphatase: 73 U/L (ref 39–117)
BILIRUBIN DIRECT: 0.1 mg/dL (ref 0.0–0.3)
TOTAL PROTEIN: 6.8 g/dL (ref 6.0–8.3)
Total Bilirubin: 0.8 mg/dL (ref 0.2–1.2)

## 2015-06-27 LAB — LIPID PANEL
CHOLESTEROL: 172 mg/dL (ref 0–200)
HDL: 49.9 mg/dL (ref >39.00)
LDL Cholesterol: 88 mg/dL (ref 0–99)
NONHDL: 121.65
Total CHOL/HDL Ratio: 3
Triglycerides: 166 mg/dL — ABNORMAL HIGH (ref 0.0–149.0)
VLDL: 33.2 mg/dL (ref 0.0–40.0)

## 2015-06-27 LAB — BASIC METABOLIC PANEL
BUN: 12 mg/dL (ref 6–23)
CALCIUM: 9.8 mg/dL (ref 8.4–10.5)
CHLORIDE: 104 meq/L (ref 96–112)
CO2: 31 mEq/L (ref 19–32)
CREATININE: 0.9 mg/dL (ref 0.40–1.50)
GFR: 95.73 mL/min (ref >60.00)
Glucose, Bld: 94 mg/dL (ref 70–99)
Potassium: 4.4 mEq/L (ref 3.5–5.1)
SODIUM: 141 meq/L (ref 135–145)

## 2015-06-27 LAB — PSA: PSA: 1.19 ng/mL (ref 0.10–4.00)

## 2015-06-27 LAB — TSH: TSH: 1.15 u[IU]/mL (ref 0.35–4.50)

## 2015-06-27 NOTE — Patient Instructions (Signed)
Follow up in 6 months to recheck cholesterol We'll notify you of your lab results and make any changes if needed QUIT SMOKING!!!  You can do it!!! Continue to work on healthy diet and regular exercise- you look great! Call with any questions or concerns If you want to join us at the new [place] office, any scheduled appointments will automatically transfer and we will see you at [address]big[address], [place], [place] [address] (OPENING 07/10/15) Happy Holidays!!!

## 2015-06-27 NOTE — Assessment & Plan Note (Signed)
Pt's PE WNL.  Declines flu and Tdap.  Check labs.  Anticipatory guidance provided.  

## 2015-06-27 NOTE — Progress Notes (Signed)
Pre visit review using our clinic review tool, if applicable. No additional management support is needed unless otherwise documented below in the visit note. 

## 2015-06-27 NOTE — Progress Notes (Signed)
   Subjective:    Patient ID: Maurice NeasJerry L Moye Jr., male    DOB: February 18, 1966, 49 y.o.   MRN: [id number]  HPI CPE- no concerns.  Declines flu, Tdap.     Review of Systems Patient reports no vision/hearing changes, anorexia, fever ,adenopathy, persistant/recurrent hoarseness, swallowing issues, chest pain, palpitations, edema, persistant/recurrent cough, hemoptysis, dyspnea (rest,exertional, paroxysmal nocturnal), gastrointestinal  bleeding (melena, rectal bleeding), abdominal pain, excessive heart burn, GU symptoms (dysuria, hematuria, voiding/incontinence issues) syncope, focal weakness, memory loss, numbness & tingling, skin/hair/nail changes, depression, anxiety, abnormal bruising/bleeding, musculoskeletal symptoms/signs.     Objective:   Physical Exam General Appearance:    Alert, cooperative, no distress, appears stated age  Head:    Normocephalic, without obvious abnormality, atraumatic  Eyes:    PERRL, conjunctiva/corneas clear, EOM's intact, fundi    benign, both eyes       Ears:    Normal TM's and external ear canals, both ears  Nose:   Nares normal, septum midline, mucosa normal, no drainage   or sinus tenderness  Throat:   Lips, mucosa, and tongue normal; teeth and gums normal  Neck:   Supple, symmetrical, trachea midline, no adenopathy;       thyroid:  No enlargement/tenderness/nodules  Back:     Symmetric, no curvature, ROM normal, no CVA tenderness  Lungs:     Clear to auscultation bilaterally, respirations unlabored  Chest wall:    No tenderness or deformity  Heart:    Regular rate and rhythm, S1 and S2 normal, no murmur, rub   or gallop  Abdomen:     Soft, non-tender, bowel sounds active all four quadrants,    no masses, no organomegaly  Genitalia:    Normal male without lesion, masses,discharge or tenderness  Rectal:    Deferred due to young age  Extremities:   Extremities normal, atraumatic, no cyanosis or edema  Pulses:   2+ and symmetric all extremities  Skin:   Skin  color, texture, turgor normal, no rashes or lesions  Lymph nodes:   Cervical, supraclavicular, and axillary nodes normal  Neurologic:   CNII-XII intact. Normal strength, sensation and reflexes      throughout          Assessment & Plan:

## 2015-07-11 ENCOUNTER — Other Ambulatory Visit: Payer: Self-pay | Admitting: Family Medicine

## 2015-08-28 ENCOUNTER — Telehealth: Payer: Self-pay | Admitting: Family Medicine

## 2015-08-28 NOTE — Telephone Encounter (Signed)
Pharmacy: [id number] [address] - [place], [place] - [address]  Reason for call: Pt called requesting refill on prednisone. Last RX 06/18/15 for back pain. Pt states the pain is in his hip and down his leg now and thinks the prednisone will help. Advised pt he will likely need another appt. Declined scheduling at this time until he hears.

## 2015-08-28 NOTE — Telephone Encounter (Signed)
Ok for refill on pred taper.  If no improvement, will need appt for complete evaluation and/or ortho referral

## 2015-08-29 MED ORDER — PREDNISONE 10 MG PO TABS: ORAL_TABLET | ORAL | Status: DC

## 2015-08-29 NOTE — Telephone Encounter (Signed)
Medication filled to pharmacy as requested. Pt also informed that if pain does not resolve that he needs to make an appt with PCP. Pt stated an understanding.

## 2015-10-18 ENCOUNTER — Ambulatory Visit (INDEPENDENT_AMBULATORY_CARE_PROVIDER_SITE_OTHER): Payer: 59 | Admitting: Physician Assistant

## 2015-10-18 VITALS — BP 116/78 | HR 63 | Temp 97.5°F | Resp 16 | Ht 74.5 in | Wt 192.0 lb

## 2015-10-18 DIAGNOSIS — H6122 Impacted cerumen, left ear: Secondary | ICD-10-CM | POA: Diagnosis not present
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
## 2015-10-18 NOTE — Patient Instructions (Addendum)
     IF you received an x-ray today, you will receive an invoice from [facility]. Please contact [facility] at [phone] with questions or concerns regarding your invoice.   IF you received labwork today, you will receive an invoice from [facility]. Please contact [facility] at [phone] with questions or concerns regarding your invoice.   Our billing staff will not be able to assist you with questions regarding bills from these companies.  You will be contacted with the lab results as soon as they are available. The fastest way to get your results is to activate your My Chart account. Instructions are located on the last page of this paperwork. If you have not heard from us regarding the results in 2 weeks, please contact this office.    Cerumen Impaction The structures of the external ear canal secrete a waxy substance known as cerumen. Excess cerumen can build up in the ear canal, causing a condition known as cerumen impaction. Cerumen impaction can cause ear pain and disrupt the function of the ear. The rate of cerumen production differs for each individual. In certain individuals, the configuration of the ear canal may decrease his or her ability to naturally remove cerumen. CAUSES Cerumen impaction is caused by excessive cerumen production or buildup. RISK FACTORS  Frequent use of swabs to clean ears.  Having narrow ear canals.  Having eczema.  Being dehydrated. SIGNS AND SYMPTOMS  Diminished hearing.  Ear drainage.  Ear pain.  Ear itch. TREATMENT Treatment may involve:  Over-the-counter or prescription ear drops to soften the cerumen.  Removal of cerumen by a health care provider. This may be done with:  Irrigation with warm water. This is the most common method of removal.  Ear curettes and other instruments.  Surgery. This may be done in severe cases. HOME CARE INSTRUCTIONS  Take medicines only as directed by  your health care provider.  Do not insert objects into the ear with the intent of cleaning the ear. PREVENTION  Do not insert objects into the ear, even with the intent of cleaning the ear. Removing cerumen as a part of normal hygiene is not necessary, and the use of swabs in the ear canal is not recommended.  Drink enough water to keep your urine clear or pale yellow.  Control your eczema if you have it. SEEK MEDICAL CARE IF:  You develop ear pain.  You develop bleeding from the ear.  The cerumen does not clear after you use ear drops as directed.   This information is not intended to replace advice given to you by your health care provider. Make sure you discuss any questions you have with your health care provider.   Document Released: 07/31/2004 Document Revised: 07/14/2014 Document Reviewed: 02/07/2015 Elsevier Interactive Patient Education 2016 [employer]  

## 2015-12-21 ENCOUNTER — Other Ambulatory Visit: Payer: Self-pay | Admitting: Family Medicine

## 2015-12-21 NOTE — Telephone Encounter (Signed)
Medication filled to pharmacy as requested.   

## 2016-03-04 ENCOUNTER — Other Ambulatory Visit: Payer: Self-pay | Admitting: Family Medicine

## 2016-05-06 IMAGING — DX DG LUMBAR SPINE COMPLETE 4+V
5 series · 5 of 5 positions shown · non-contrast
Comparison: None.

CLINICAL DATA: Two week history of lumbago with left-sided
radicular symptoms

EXAM:
LUMBAR SPINE - COMPLETE 4+ VIEW

[l-spine ap]
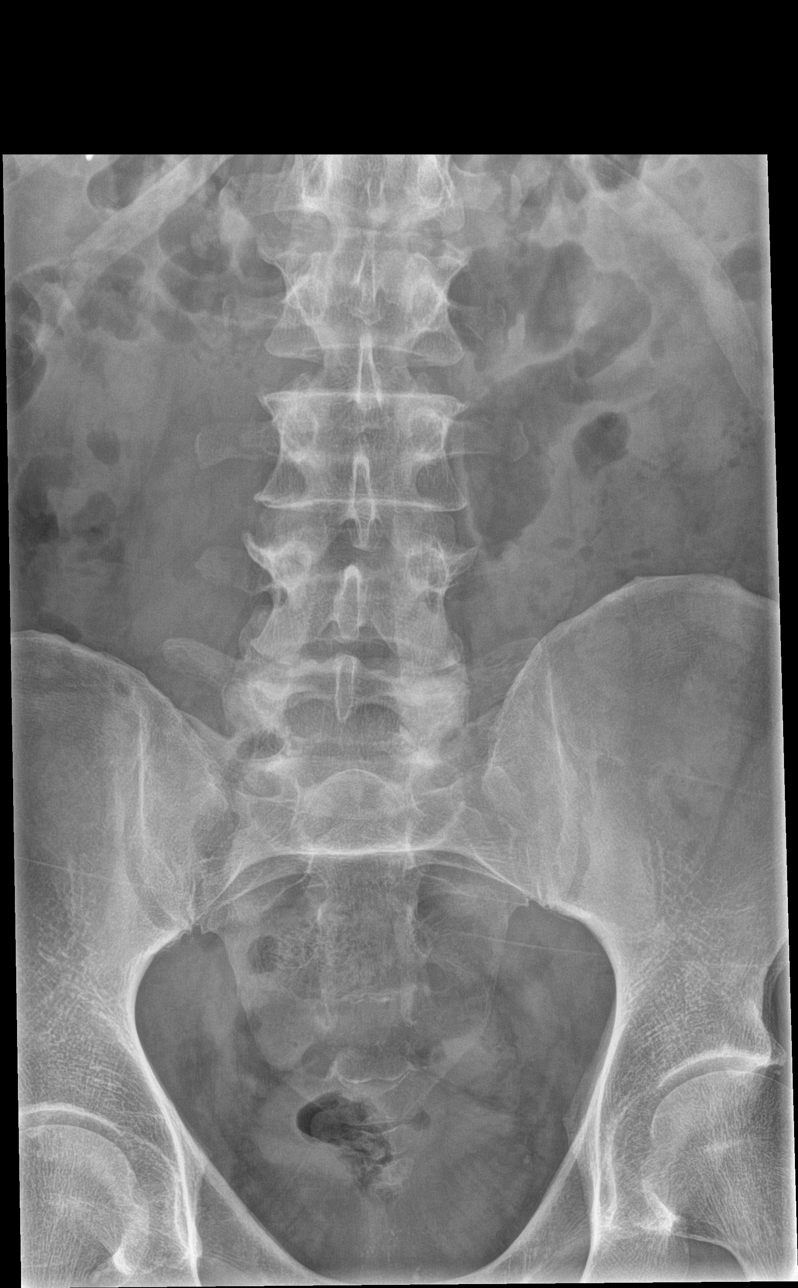

[l-spine obl (1 of 2)]
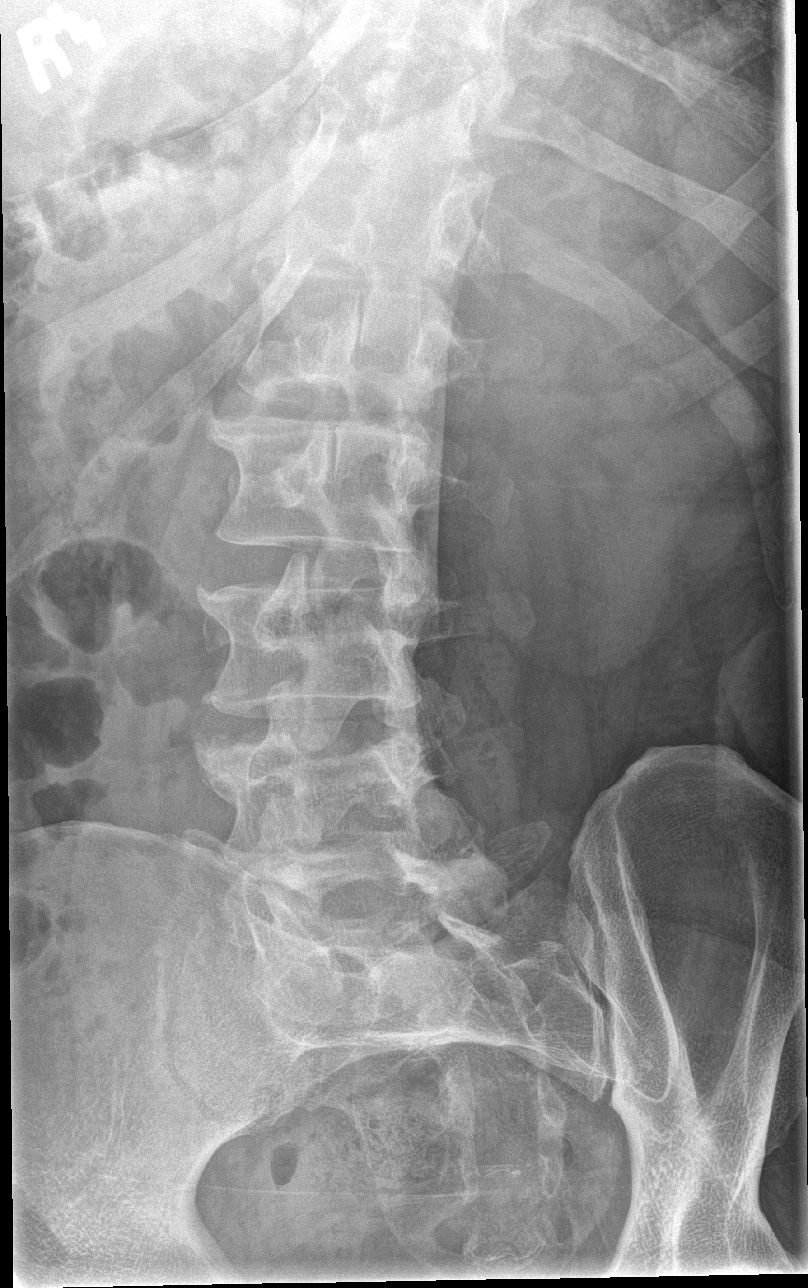

[l-spine obl (2 of 2)]
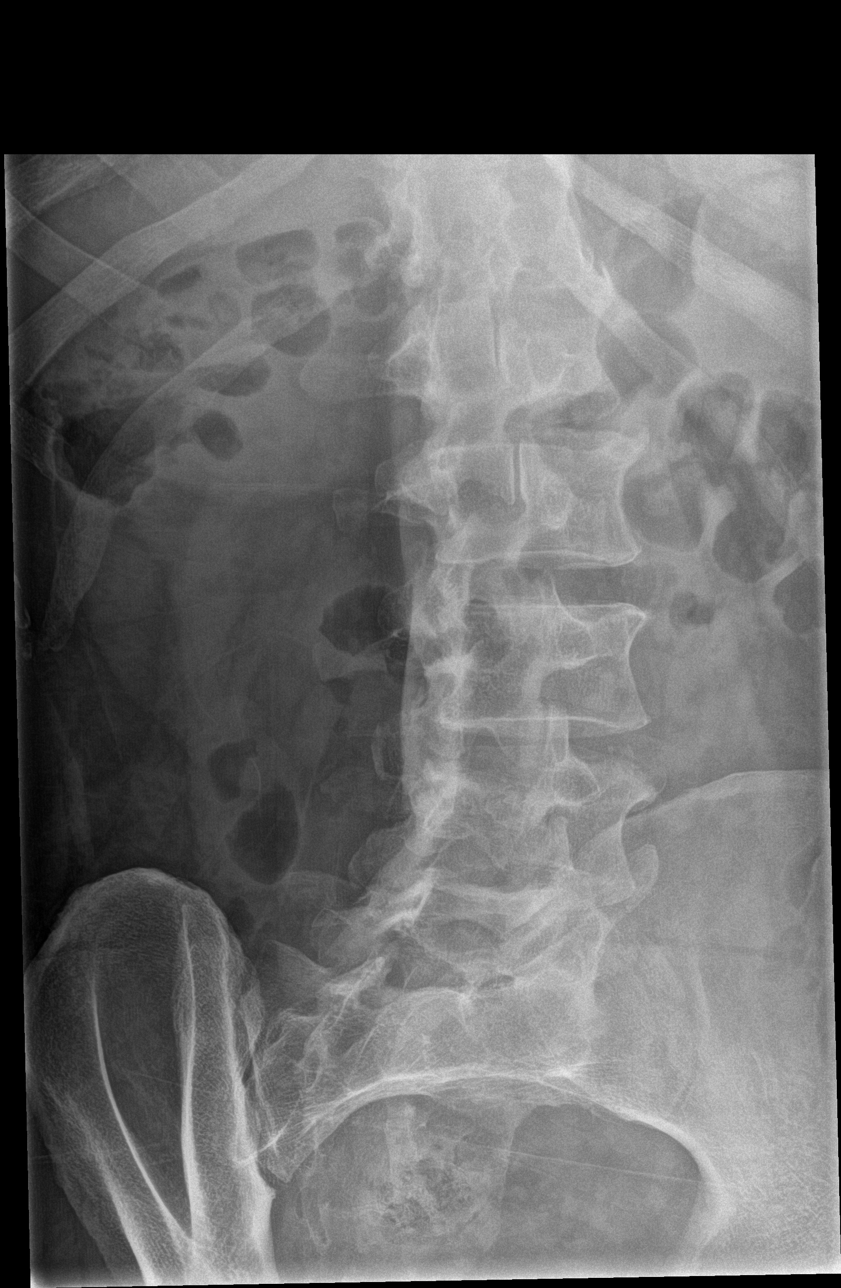

[l-spine lat]
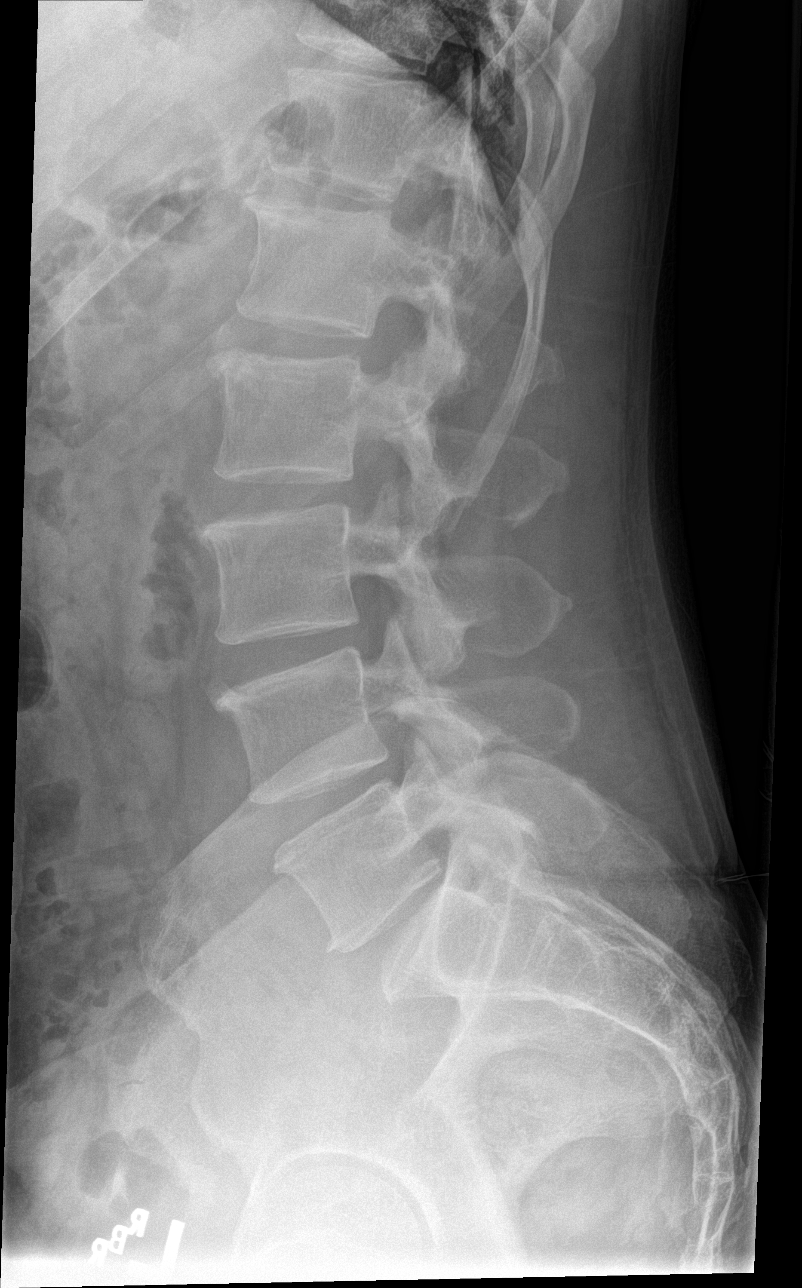

[l-spine spot]
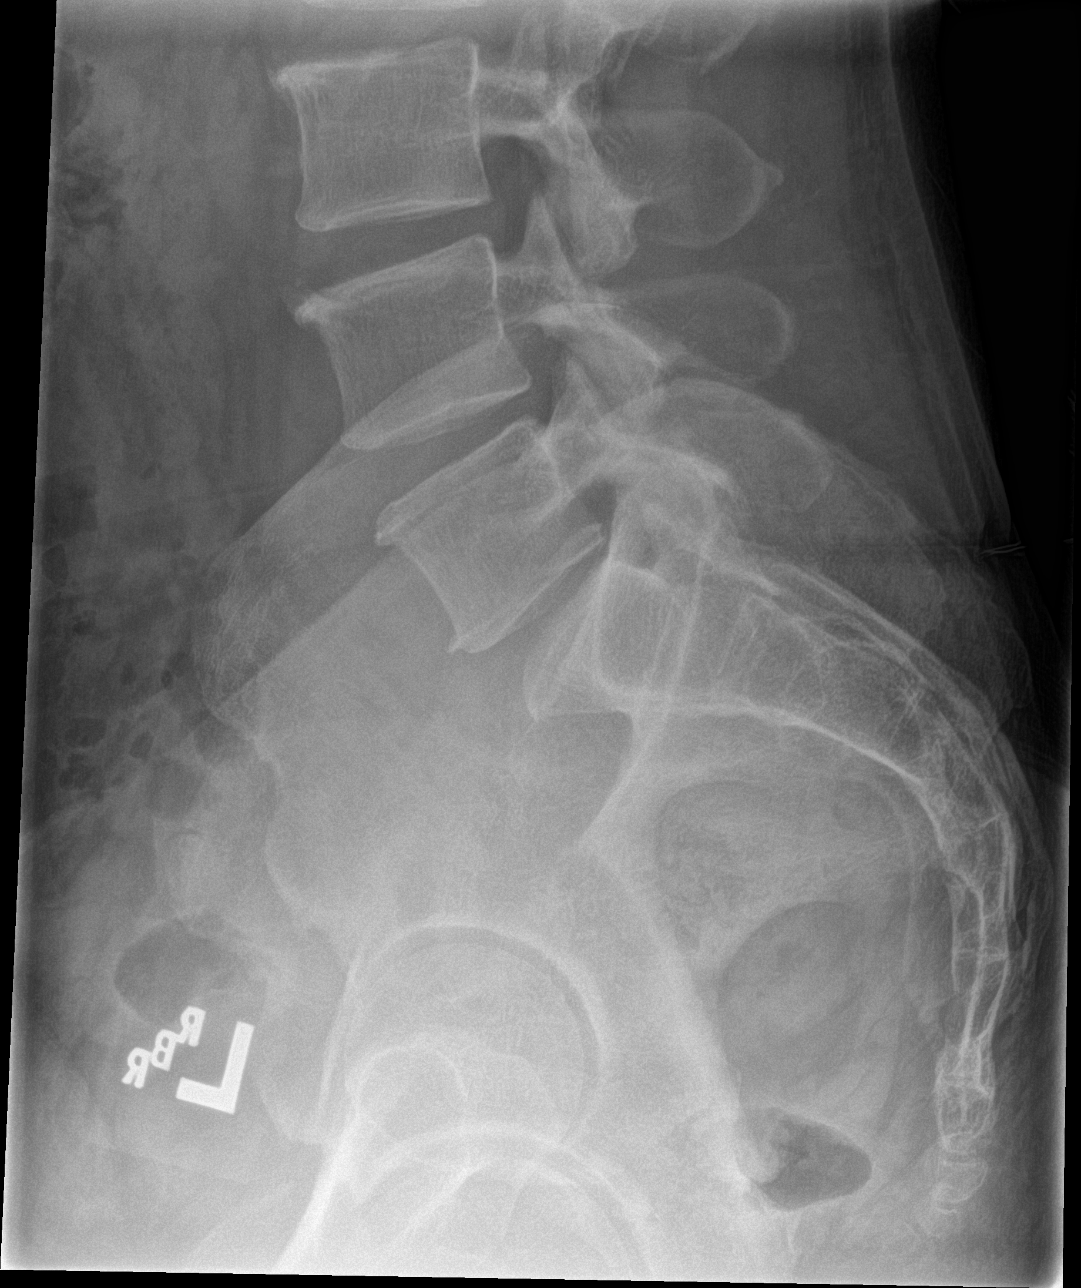

[5 of 5 positions shown; findings below may reference images not displayed]

FINDINGS: Frontal, lateral, spot lumbosacral lateral, and bilateral oblique
views were obtained. The there are 5 non-rib-bearing lumbar type
vertebral bodies. There is no fracture or spondylolisthesis. There
is disc space narrowing at L4-5 and L5-S1. There are small anterior
osteophytes at all levels. There is facet osteoarthritic change at
L5-S1 bilaterally.
IMPRESSION: Areas of osteoarthritic change.  No fracture or spondylolisthesis.

## 2016-06-25 ENCOUNTER — Encounter: Payer: Self-pay | Admitting: Family Medicine

## 2016-06-25 ENCOUNTER — Ambulatory Visit (INDEPENDENT_AMBULATORY_CARE_PROVIDER_SITE_OTHER): Payer: BLUE CROSS/BLUE SHIELD | Admitting: Family Medicine

## 2016-06-25 VITALS — BP 104/70 | HR 60 | Temp 98.0°F | Ht 75.0 in | Wt 174.2 lb

## 2016-06-25 DIAGNOSIS — H6123 Impacted cerumen, bilateral: Secondary | ICD-10-CM | POA: Diagnosis not present

## 2016-06-25 NOTE — Progress Notes (Signed)
Chief Complaint  Patient presents with  . Cerumen Impaction    bilateral ear blockage   Pt is a 50 yo male here for b/l ears clogged. +Hx of cerumen impaction. +hearing loss. Denies pain, drainage, fevers.  Past Medical History:  Diagnosis Date  . Hyperlipidemia    BP 104/70 (BP Location: Left Arm, Patient Position: Sitting, Cuff Size: Small)   Pulse 60   Temp 98 F (36.7 C) (Oral)   Ht 6\' 3"  (1.905 m)   Wt 174 lb 3.2 oz (79 kg)   SpO2 98%   BMI 21.77 kg/m  Gen: Awake, alert, appears stated age Ears: No TTP, Canals 100% obstructed with cerumen b/l. Psych: Age appropriate judgment and insight.  Procedure note CMA performed procedure Verbal consent obtained Mixture of warm water and peroxide used to irrigate both canals Cerumen successfully removed Pt tolerated procedure well No complications noted  Hearing loss due to cerumen impaction, bilateral  Ear irrigation b/l. Scheduled flushes with peroxide and water may be best to keep. F/u prn otherwise. Pt voiced understanding and agreement to the plan.  Jilda Rocheicholas Paul StephenWendling, DO 06/25/16 11:58 AM

## 2016-06-25 NOTE — Progress Notes (Signed)
Pre visit review using our clinic review tool, if applicable. No additional management support is needed unless otherwise documented below in the visit note. 

## 2017-01-08 ENCOUNTER — Encounter: Payer: Self-pay | Admitting: General Practice

## 2017-01-08 ENCOUNTER — Other Ambulatory Visit: Payer: Self-pay | Admitting: General Practice

## 2017-01-08 MED ORDER — OMEPRAZOLE 40 MG PO CPDR: 40.0000 mg | DELAYED_RELEASE_CAPSULE | Freq: Every day | ORAL | 0 refills | Status: AC

## 2017-09-09 ENCOUNTER — Ambulatory Visit (INDEPENDENT_AMBULATORY_CARE_PROVIDER_SITE_OTHER): Payer: Self-pay | Admitting: Family Medicine

## 2017-09-09 ENCOUNTER — Encounter: Payer: Self-pay | Admitting: Family Medicine

## 2017-09-09 VITALS — BP 120/78 | HR 70 | Temp 98.2°F | Ht 75.0 in | Wt 192.0 lb

## 2017-09-09 DIAGNOSIS — L089 Local infection of the skin and subcutaneous tissue, unspecified: Secondary | ICD-10-CM

## 2017-09-09 MED ORDER — DOXYCYCLINE HYCLATE 100 MG PO TABS: 100.0000 mg | ORAL_TABLET | Freq: Two times a day (BID) | ORAL | 0 refills | Status: AC

## 2017-09-09 NOTE — Patient Instructions (Addendum)
Things to look out for: increasing pain not relieved by ibuprofen/acetaminophen, fevers, spreading redness, or foul odor.  If this does not improve by 1 week, schedule an appointment with me to avoid paying an additional office visit fee.   Let us know if you need anything.

## 2017-09-09 NOTE — Progress Notes (Signed)
Chief Complaint  Patient presents with  . Cyst    behind left ear    Maurice NeasJerry L Antone Jr. is a 52 y.o. male here for a skin complaint.  Duration: 6 days Location: behind L ear Pruritic? No Painful? Yes Drainage? Yes New soaps/lotions/topicals/detergents? No Sick contacts? No Other associated symptoms: Redness according to his wife  ROS: Skin: As noted in HPI  Past Medical History:  Diagnosis Date  . Hyperlipidemia    No Known Allergies   BP 120/78 (BP Location: Left Arm, Patient Position: Sitting, Cuff Size: Large)   Pulse 70   Temp 98.2 F (36.8 C) (Oral)   Ht 6\' 3"  (1.905 m)   Wt 192 lb (87.1 kg)   SpO2 97%   BMI 24.00 kg/m  Gen: awake, alert, appearing stated age Lungs: No accessory muscle use Skin: See below. No drainage, +erythema, +TTP, very minimalfluctuance, no excoriation Psych: Age appropriate judgment and insight  Media Information     Skin infection - Plan: doxycycline (VIBRA-TABS) 100 MG tablet  Orders as above. I don't think he needs I&D. Warning s/s's verbalized and written down. F/u prn- should f/u with me because if he needs I&D will not have eval/tx fee. The patient voiced understanding and agreement to the plan.  Jilda Rocheicholas Paul ShinnstonWendling, DO 09/09/17 9:52 AM

## 2017-09-09 NOTE — Progress Notes (Signed)
Pre visit review using our clinic review tool, if applicable. No additional management support is needed unless otherwise documented below in the visit note.
